# Patient Record
Sex: Male | Born: 1985 | Race: Black or African American | Hispanic: No | Marital: Single | Smoking: Never smoker
Health system: Southern US, Community
[De-identification: ages and names within clinical notes are randomized; demographics above are authoritative.]

---
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
## 2015-12-25 ENCOUNTER — Telehealth: Payer: Self-pay | Admitting: *Deleted

## 2015-12-25 ENCOUNTER — Ambulatory Visit: Payer: Self-pay | Admitting: Neurology

## 2015-12-25 NOTE — Telephone Encounter (Signed)
no showed new patient appt 

## 2016-01-01 ENCOUNTER — Encounter: Payer: Self-pay | Admitting: Neurology

## 2016-06-29 ENCOUNTER — Emergency Department (HOSPITAL_COMMUNITY): Payer: Self-pay

## 2016-06-29 ENCOUNTER — Emergency Department (HOSPITAL_COMMUNITY)
Admission: EM | Admit: 2016-06-29 | Discharge: 2016-06-30 | Disposition: A | Discharge status: Home / Self Care | Payer: Self-pay | Attending: Emergency Medicine | Admitting: Emergency Medicine

## 2016-06-29 ENCOUNTER — Encounter (HOSPITAL_COMMUNITY): Payer: Self-pay | Admitting: Emergency Medicine

## 2016-06-29 DIAGNOSIS — S0083XA Contusion of other part of head, initial encounter: Secondary | ICD-10-CM

## 2016-06-29 DIAGNOSIS — Y939 Activity, unspecified: Secondary | ICD-10-CM | POA: Insufficient documentation

## 2016-06-29 DIAGNOSIS — S50311A Abrasion of right elbow, initial encounter: Secondary | ICD-10-CM | POA: Insufficient documentation

## 2016-06-29 DIAGNOSIS — Y999 Unspecified external cause status: Secondary | ICD-10-CM | POA: Insufficient documentation

## 2016-06-29 DIAGNOSIS — Y929 Unspecified place or not applicable: Secondary | ICD-10-CM | POA: Insufficient documentation

## 2016-06-29 DIAGNOSIS — S61214A Laceration without foreign body of right ring finger without damage to nail, initial encounter: Secondary | ICD-10-CM | POA: Insufficient documentation

## 2016-06-29 DIAGNOSIS — S0282XA Fracture of other specified skull and facial bones, left side, initial encounter for closed fracture: Secondary | ICD-10-CM | POA: Insufficient documentation

## 2016-06-29 DIAGNOSIS — S0285XA Fracture of orbit, unspecified, initial encounter for closed fracture: Secondary | ICD-10-CM

## 2016-06-29 MED ORDER — OXYCODONE-ACETAMINOPHEN 5-325 MG PO TABS
1.0000 | ORAL_TABLET | Freq: Once | ORAL | Status: AC
  Administered 2016-06-29: 1 via ORAL
  Filled 2016-06-29: qty 1

## 2016-06-29 MED ORDER — TETANUS-DIPHTH-ACELL PERTUSSIS 5-2.5-18.5 LF-MCG/0.5 IM SUSP
0.5000 mL | Freq: Once | INTRAMUSCULAR | Status: AC
  Administered 2016-06-29: 0.5 mL via INTRAMUSCULAR
  Filled 2016-06-29: qty 0.5

## 2016-06-29 MED ORDER — HYDROMORPHONE HCL 1 MG/ML IJ SOLN
1.0000 mg | Freq: Once | INTRAMUSCULAR | Status: AC
  Administered 2016-06-29: 1 mg via INTRAVENOUS
  Filled 2016-06-29: qty 1

## 2016-06-29 MED ORDER — OXYCODONE-ACETAMINOPHEN 5-325 MG PO TABS: 2.0000 | ORAL_TABLET | ORAL | 0 refills | Status: AC | PRN

## 2016-06-29 MED ORDER — AMOXICILLIN 500 MG PO CAPS: 500.0000 mg | ORAL_CAPSULE | Freq: Two times a day (BID) | ORAL | 0 refills | Status: AC

## 2016-06-29 NOTE — ED Notes (Signed)
Dermabond at bedside for provider 

## 2016-06-29 NOTE — ED Notes (Signed)
Patient transported to CT 

## 2016-06-29 NOTE — ED Notes (Signed)
ED Provider at bedside. 
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
## 2016-06-29 NOTE — Discharge Instructions (Signed)
Follow up with Ophthalmology and ENT in clinic. - No nose blowing - Ice for 24 hours - antibiotics  Return to the emergency department if you experience nausea, vomiting, dizziness, worsening headache,

## 2016-06-30 ENCOUNTER — Emergency Department (HOSPITAL_COMMUNITY): Payer: Self-pay

## 2016-06-30 LAB — CBC WITH DIFFERENTIAL/PLATELET
Basophils Absolute: 0 10*3/uL (ref 0.0–0.1)
Basophils Relative: 0 %
Eosinophils Absolute: 0 10*3/uL (ref 0.0–0.7)
Eosinophils Relative: 0 %
HCT: 41.4 % (ref 39.0–52.0)
Hemoglobin: 14.4 g/dL (ref 13.0–17.0)
Lymphocytes Relative: 17 %
Lymphs Abs: 2.1 10*3/uL (ref 0.7–4.0)
MCH: 31.6 pg (ref 26.0–34.0)
MCHC: 34.8 g/dL (ref 30.0–36.0)
MCV: 91 fL (ref 78.0–100.0)
Monocytes Absolute: 1.1 10*3/uL — ABNORMAL HIGH (ref 0.1–1.0)
Monocytes Relative: 9 %
Neutro Abs: 8.8 10*3/uL — ABNORMAL HIGH (ref 1.7–7.7)
Neutrophils Relative %: 74 %
Platelets: 251 10*3/uL (ref 150–400)
RBC: 4.55 MIL/uL (ref 4.22–5.81)
RDW: 13 % (ref 11.5–15.5)
WBC: 12.1 10*3/uL — ABNORMAL HIGH (ref 4.0–10.5)

## 2016-06-30 LAB — COMPREHENSIVE METABOLIC PANEL
ALT: 25 U/L (ref 17–63)
AST: 33 U/L (ref 15–41)
Albumin: 4.1 g/dL (ref 3.5–5.0)
Alkaline Phosphatase: 48 U/L (ref 38–126)
Anion gap: 8 (ref 5–15)
BUN: 9 mg/dL (ref 6–20)
CO2: 24 mmol/L (ref 22–32)
Calcium: 9.1 mg/dL (ref 8.9–10.3)
Chloride: 107 mmol/L (ref 101–111)
Creatinine, Ser: 1.12 mg/dL (ref 0.61–1.24)
GFR calc Af Amer: >60 mL/min (ref >60)
GFR calc non Af Amer: >60 mL/min (ref >60)
Glucose, Bld: 124 mg/dL — ABNORMAL HIGH (ref 65–99)
Potassium: 3.5 mmol/L (ref 3.5–5.1)
Sodium: 139 mmol/L (ref 135–145)
Total Bilirubin: 0.8 mg/dL (ref 0.3–1.2)
Total Protein: 7.2 g/dL (ref 6.5–8.1)

## 2016-06-30 MED ORDER — DIPHENHYDRAMINE HCL 25 MG PO CAPS
50.0000 mg | ORAL_CAPSULE | Freq: Once | ORAL | Status: AC
  Administered 2016-06-30: 50 mg via ORAL
  Filled 2016-06-30: qty 2

## 2016-06-30 MED ORDER — SODIUM CHLORIDE 0.9 % IV BOLUS (SEPSIS): 1000.0000 mL | Freq: Once | INTRAVENOUS | Status: DC

## 2016-06-30 MED ORDER — ERYTHROMYCIN 5 MG/GM OP OINT
1.0000 "application " | TOPICAL_OINTMENT | Freq: Once | OPHTHALMIC | Status: AC
  Administered 2016-06-30: 1 via OPHTHALMIC
  Filled 2016-06-30: qty 3.5
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
## 2018-10-08 ENCOUNTER — Encounter: Payer: Self-pay | Admitting: Emergency Medicine

## 2018-10-08 ENCOUNTER — Ambulatory Visit
Admission: EM | Admit: 2018-10-08 | Discharge: 2018-10-08 | Disposition: A | Discharge status: Home / Self Care | Payer: Self-pay | Attending: Family Medicine | Admitting: Family Medicine

## 2018-10-08 ENCOUNTER — Other Ambulatory Visit: Payer: Self-pay

## 2018-10-08 DIAGNOSIS — Z202 Contact with and (suspected) exposure to infections with a predominantly sexual mode of transmission: Secondary | ICD-10-CM | POA: Insufficient documentation

## 2018-10-08 MED ORDER — CEFTRIAXONE SODIUM 250 MG IJ SOLR
250.0000 mg | Freq: Once | INTRAMUSCULAR | Status: AC
  Administered 2018-10-08: 18:00:00 250 mg via INTRAMUSCULAR

## 2018-10-08 MED ORDER — AZITHROMYCIN 500 MG PO TABS
1000.0000 mg | ORAL_TABLET | Freq: Once | ORAL | Status: AC
  Administered 2018-10-08: 1000 mg via ORAL

## 2018-10-08 NOTE — Discharge Instructions (Signed)
We have treated you today for gonorrhea and chlamydia, with Rocephin and azithromycin. Please refrain from sexual intercourse for 7 days while medicines eliminating infection.  ° °We are testing you for HIV, syphilis, gonorrhea, Chlamydia, Trichomonas, Yeast and Bacterial Vaginosis. We will call you if anything is positive and let you know if you require any further treatment. Please inform partners of any positive results.  ° °Please return if symptoms not improving with treatment, development of fever, nausea, vomiting, abdominal pain.  °
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
## 2018-10-08 NOTE — ED Notes (Signed)
Patient able to ambulate independently  
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
## 2018-10-10 LAB — HIV ANTIBODY (ROUTINE TESTING W REFLEX): HIV Screen 4th Generation wRfx: NONREACTIVE

## 2018-10-10 LAB — RPR: RPR Ser Ql: NONREACTIVE

## 2018-10-12 LAB — URINE CYTOLOGY ANCILLARY ONLY
Chlamydia: NEGATIVE
Neisseria Gonorrhea: NEGATIVE
Trichomonas: NEGATIVE
